# Patient Record
Sex: Female | Born: 1970
Health system: Southern US, Community
[De-identification: ages and names within clinical notes are randomized; demographics above are authoritative.]

## PROBLEM LIST (undated history)

## (undated) DIAGNOSIS — G8929 Other chronic pain: Secondary | ICD-10-CM

## (undated) HISTORY — PX: TUBAL LIGATION: SHX77

## (undated) HISTORY — DX: Other chronic pain: G89.29

---

## 1998-03-29 ENCOUNTER — Encounter: Payer: Self-pay | Admitting: Emergency Medicine

## 1998-03-29 ENCOUNTER — Emergency Department (HOSPITAL_COMMUNITY): Admission: EM | Admit: 1998-03-29 | Discharge: 1998-03-29 | Payer: Self-pay | Admitting: Emergency Medicine

## 1998-03-31 ENCOUNTER — Emergency Department (HOSPITAL_COMMUNITY): Admission: EM | Admit: 1998-03-31 | Discharge: 1998-03-31 | Payer: Self-pay | Admitting: Emergency Medicine

## 1998-04-07 ENCOUNTER — Ambulatory Visit (HOSPITAL_COMMUNITY): Admission: RE | Admit: 1998-04-07 | Discharge: 1998-04-07 | Payer: Self-pay | Admitting: Emergency Medicine

## 1998-04-07 ENCOUNTER — Encounter: Payer: Self-pay | Admitting: Emergency Medicine

## 1998-04-20 ENCOUNTER — Ambulatory Visit (HOSPITAL_COMMUNITY): Admission: RE | Admit: 1998-04-20 | Discharge: 1998-04-20 | Payer: Self-pay | Admitting: Neurosurgery

## 1998-04-20 ENCOUNTER — Encounter: Payer: Self-pay | Admitting: Neurosurgery

## 1998-11-09 ENCOUNTER — Other Ambulatory Visit: Admission: RE | Admit: 1998-11-09 | Discharge: 1998-11-09 | Payer: Self-pay | Admitting: Internal Medicine

## 1998-11-29 ENCOUNTER — Inpatient Hospital Stay (HOSPITAL_COMMUNITY): Admission: AD | Admit: 1998-11-29 | Discharge: 1998-11-29 | Payer: Self-pay | Admitting: *Deleted

## 1998-11-30 ENCOUNTER — Inpatient Hospital Stay (HOSPITAL_COMMUNITY): Admission: AD | Admit: 1998-11-30 | Discharge: 1998-11-30 | Payer: Self-pay | Admitting: Obstetrics & Gynecology

## 1999-08-30 ENCOUNTER — Other Ambulatory Visit: Admission: RE | Admit: 1999-08-30 | Discharge: 1999-08-30 | Payer: Self-pay | Admitting: Internal Medicine

## 1999-09-05 ENCOUNTER — Inpatient Hospital Stay (HOSPITAL_COMMUNITY): Admission: EM | Admit: 1999-09-05 | Discharge: 1999-09-05 | Payer: Self-pay | Admitting: *Deleted

## 1999-09-05 ENCOUNTER — Encounter: Payer: Self-pay | Admitting: Internal Medicine

## 2000-07-02 ENCOUNTER — Emergency Department (HOSPITAL_COMMUNITY): Admission: EM | Admit: 2000-07-02 | Discharge: 2000-07-02 | Payer: Self-pay | Admitting: Emergency Medicine

## 2000-07-03 ENCOUNTER — Other Ambulatory Visit: Admission: RE | Admit: 2000-07-03 | Discharge: 2000-07-03 | Payer: Self-pay | Admitting: Gynecology

## 2000-08-26 ENCOUNTER — Encounter: Payer: Self-pay | Admitting: Obstetrics & Gynecology

## 2000-08-26 ENCOUNTER — Inpatient Hospital Stay (HOSPITAL_COMMUNITY): Admission: AD | Admit: 2000-08-26 | Discharge: 2000-08-26 | Payer: Self-pay | Admitting: Obstetrics & Gynecology

## 2000-08-28 ENCOUNTER — Ambulatory Visit (HOSPITAL_COMMUNITY): Admission: RE | Admit: 2000-08-28 | Discharge: 2000-08-28 | Payer: Self-pay | Admitting: *Deleted

## 2000-08-28 ENCOUNTER — Encounter (INDEPENDENT_AMBULATORY_CARE_PROVIDER_SITE_OTHER): Payer: Self-pay

## 2000-09-17 ENCOUNTER — Encounter: Admission: RE | Admit: 2000-09-17 | Discharge: 2000-09-17 | Payer: Self-pay | Admitting: Obstetrics & Gynecology

## 2002-08-02 ENCOUNTER — Inpatient Hospital Stay (HOSPITAL_COMMUNITY): Admission: AD | Admit: 2002-08-02 | Discharge: 2002-08-02 | Payer: Self-pay | Admitting: Family Medicine

## 2002-08-02 ENCOUNTER — Encounter: Payer: Self-pay | Admitting: *Deleted

## 2002-10-15 ENCOUNTER — Encounter: Payer: Self-pay | Admitting: Emergency Medicine

## 2002-10-15 ENCOUNTER — Ambulatory Visit (HOSPITAL_COMMUNITY): Admission: RE | Admit: 2002-10-15 | Discharge: 2002-10-15 | Payer: Self-pay | Admitting: Emergency Medicine

## 2002-10-15 ENCOUNTER — Emergency Department (HOSPITAL_COMMUNITY): Admission: AD | Admit: 2002-10-15 | Discharge: 2002-10-15 | Payer: Self-pay | Admitting: Emergency Medicine

## 2002-12-26 ENCOUNTER — Emergency Department (HOSPITAL_COMMUNITY): Admission: EM | Admit: 2002-12-26 | Discharge: 2002-12-26 | Payer: Self-pay | Admitting: Emergency Medicine

## 2002-12-30 ENCOUNTER — Emergency Department (HOSPITAL_COMMUNITY): Admission: AD | Admit: 2002-12-30 | Discharge: 2002-12-30 | Payer: Self-pay | Admitting: Family Medicine

## 2003-01-05 ENCOUNTER — Encounter: Admission: RE | Admit: 2003-01-05 | Discharge: 2003-01-05 | Payer: Self-pay | Admitting: Family Medicine

## 2003-04-01 ENCOUNTER — Encounter: Admission: RE | Admit: 2003-04-01 | Discharge: 2003-04-01 | Payer: Self-pay | Admitting: Family Medicine

## 2003-04-20 ENCOUNTER — Other Ambulatory Visit: Admission: RE | Admit: 2003-04-20 | Discharge: 2003-04-20 | Payer: Self-pay | Admitting: Family Medicine

## 2003-04-20 ENCOUNTER — Encounter: Admission: RE | Admit: 2003-04-20 | Discharge: 2003-04-20 | Payer: Self-pay | Admitting: Sports Medicine

## 2003-04-22 ENCOUNTER — Ambulatory Visit (HOSPITAL_COMMUNITY): Admission: RE | Admit: 2003-04-22 | Discharge: 2003-04-22 | Payer: Self-pay | Admitting: Sports Medicine

## 2003-05-28 ENCOUNTER — Encounter: Admission: RE | Admit: 2003-05-28 | Discharge: 2003-05-28 | Payer: Self-pay | Admitting: Family Medicine

## 2003-06-04 ENCOUNTER — Ambulatory Visit (HOSPITAL_COMMUNITY): Admission: RE | Admit: 2003-06-04 | Discharge: 2003-06-04 | Payer: Self-pay | Admitting: Family Medicine

## 2003-06-16 ENCOUNTER — Inpatient Hospital Stay (HOSPITAL_COMMUNITY): Admission: AD | Admit: 2003-06-16 | Discharge: 2003-06-17 | Payer: Self-pay | Admitting: *Deleted

## 2003-06-22 ENCOUNTER — Encounter: Admission: RE | Admit: 2003-06-22 | Discharge: 2003-06-22 | Payer: Self-pay | Admitting: Family Medicine

## 2003-06-23 ENCOUNTER — Encounter: Admission: RE | Admit: 2003-06-23 | Discharge: 2003-06-23 | Payer: Self-pay | Admitting: Obstetrics & Gynecology

## 2003-06-30 ENCOUNTER — Encounter: Admission: RE | Admit: 2003-06-30 | Discharge: 2003-06-30 | Payer: Self-pay | Admitting: *Deleted

## 2003-07-14 ENCOUNTER — Encounter: Admission: RE | Admit: 2003-07-14 | Discharge: 2003-07-14 | Payer: Self-pay | Admitting: *Deleted

## 2003-07-28 ENCOUNTER — Encounter: Admission: RE | Admit: 2003-07-28 | Discharge: 2003-07-28 | Payer: Self-pay | Admitting: *Deleted

## 2003-08-11 ENCOUNTER — Ambulatory Visit (HOSPITAL_COMMUNITY): Admission: RE | Admit: 2003-08-11 | Discharge: 2003-08-11 | Payer: Self-pay | Admitting: Obstetrics and Gynecology

## 2003-08-11 ENCOUNTER — Encounter: Admission: RE | Admit: 2003-08-11 | Discharge: 2003-08-11 | Payer: Self-pay | Admitting: *Deleted

## 2003-08-17 ENCOUNTER — Encounter: Admission: RE | Admit: 2003-08-17 | Discharge: 2003-08-17 | Payer: Self-pay | Admitting: *Deleted

## 2003-08-18 ENCOUNTER — Encounter: Admission: RE | Admit: 2003-08-18 | Discharge: 2003-08-18 | Payer: Self-pay | Admitting: *Deleted

## 2003-09-02 ENCOUNTER — Encounter: Admission: RE | Admit: 2003-09-02 | Discharge: 2003-09-02 | Payer: Self-pay | Admitting: *Deleted

## 2003-09-09 ENCOUNTER — Encounter: Admission: RE | Admit: 2003-09-09 | Discharge: 2003-09-09 | Payer: Self-pay | Admitting: *Deleted

## 2003-09-22 ENCOUNTER — Encounter: Admission: RE | Admit: 2003-09-22 | Discharge: 2003-09-22 | Payer: Self-pay | Admitting: *Deleted

## 2003-09-29 ENCOUNTER — Encounter: Admission: RE | Admit: 2003-09-29 | Discharge: 2003-09-29 | Payer: Self-pay | Admitting: *Deleted

## 2003-10-08 ENCOUNTER — Ambulatory Visit: Admission: RE | Admit: 2003-10-08 | Discharge: 2003-10-08 | Payer: Self-pay | Admitting: *Deleted

## 2003-10-11 ENCOUNTER — Inpatient Hospital Stay (HOSPITAL_COMMUNITY): Admission: RE | Admit: 2003-10-11 | Discharge: 2003-10-11 | Payer: Self-pay | Admitting: *Deleted

## 2003-10-13 ENCOUNTER — Encounter: Admission: RE | Admit: 2003-10-13 | Discharge: 2003-10-13 | Payer: Self-pay | Admitting: *Deleted

## 2003-10-18 ENCOUNTER — Encounter: Admission: RE | Admit: 2003-10-18 | Discharge: 2003-10-18 | Payer: Self-pay | Admitting: *Deleted

## 2003-10-18 ENCOUNTER — Ambulatory Visit (HOSPITAL_COMMUNITY): Admission: RE | Admit: 2003-10-18 | Discharge: 2003-10-18 | Payer: Self-pay | Admitting: *Deleted

## 2003-10-20 ENCOUNTER — Encounter: Admission: RE | Admit: 2003-10-20 | Discharge: 2003-10-20 | Payer: Self-pay | Admitting: *Deleted

## 2003-10-26 ENCOUNTER — Ambulatory Visit: Payer: Self-pay | Admitting: *Deleted

## 2003-10-26 ENCOUNTER — Inpatient Hospital Stay (HOSPITAL_COMMUNITY): Admission: RE | Admit: 2003-10-26 | Discharge: 2003-10-29 | Payer: Self-pay | Admitting: *Deleted

## 2003-10-26 ENCOUNTER — Encounter (INDEPENDENT_AMBULATORY_CARE_PROVIDER_SITE_OTHER): Payer: Self-pay | Admitting: Specialist

## 2003-11-05 ENCOUNTER — Inpatient Hospital Stay (HOSPITAL_COMMUNITY): Admission: AD | Admit: 2003-11-05 | Discharge: 2003-11-05 | Payer: Self-pay | Admitting: *Deleted

## 2003-12-13 ENCOUNTER — Ambulatory Visit: Payer: Self-pay | Admitting: Family Medicine

## 2004-04-21 IMAGING — US US OB DETAIL+14 WK
1 series · 7 of 7 positions shown · non-contrast
Comparison: none

CLINICAL DATA: 32-year-old G3P1 with LMP of 02/26/03.  Evaluate anatomy.  Patient is 18 weeks 4 days by first ultrasound.

[Series 1: unknown · 0.18mm/px · 7 of 7 slices shown]
[im 1/7]
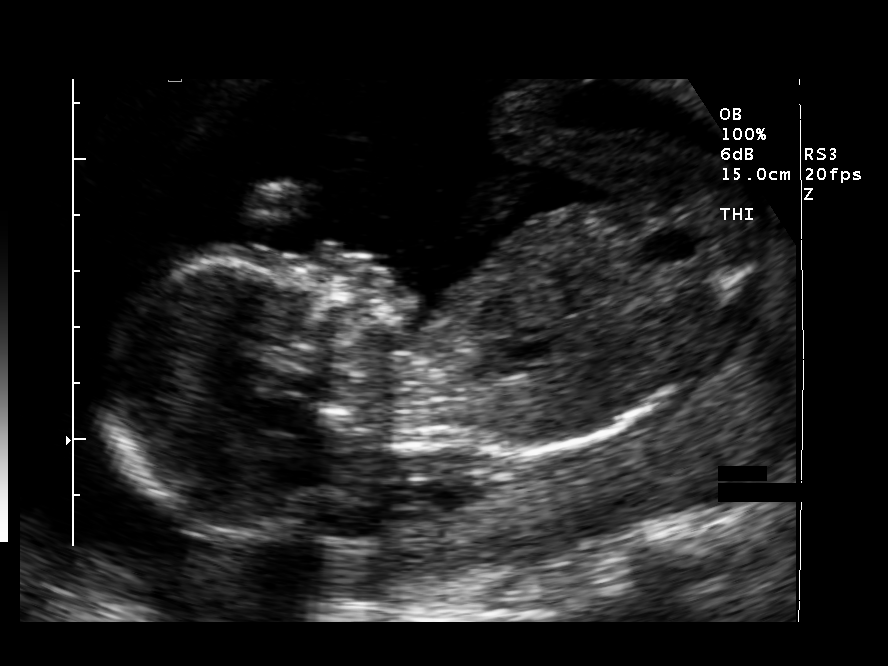
[im 2/7]
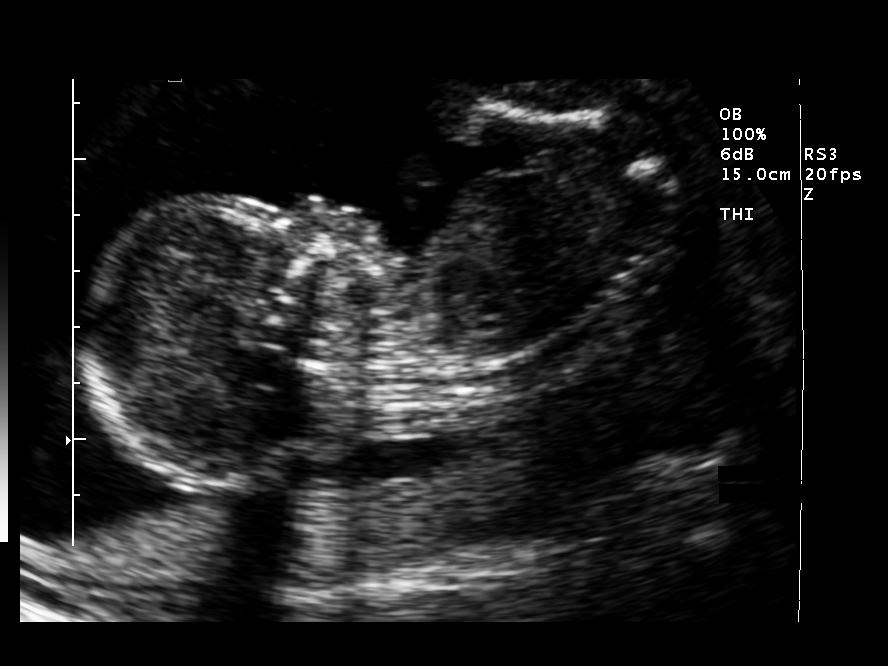
[im 3/7]
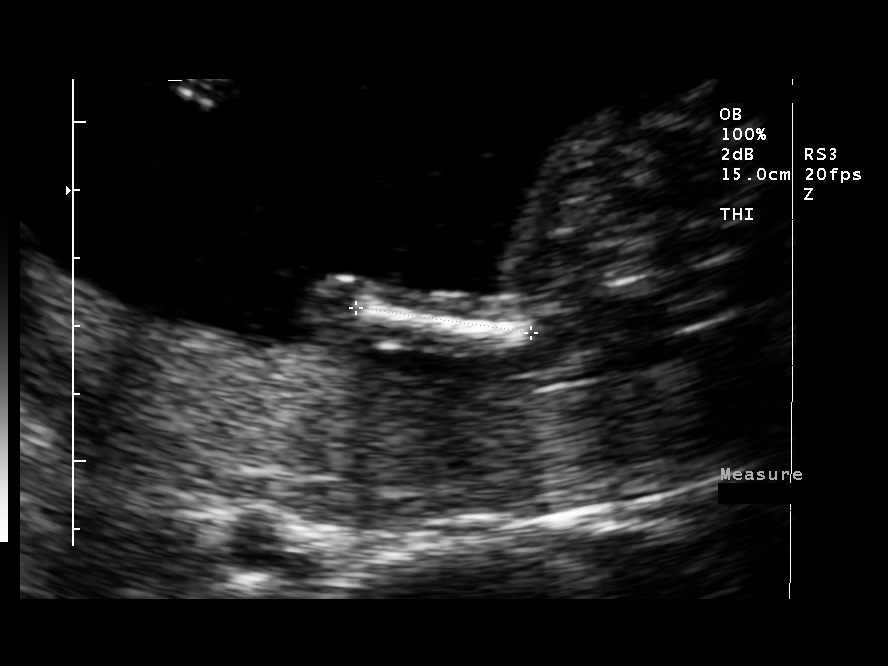
[im 4/7]
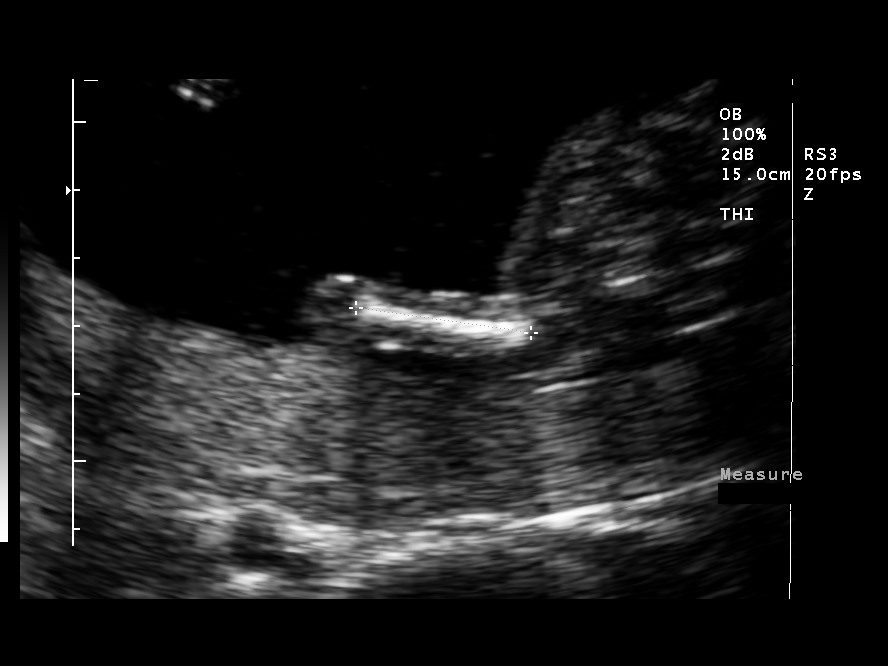
[im 5/7]
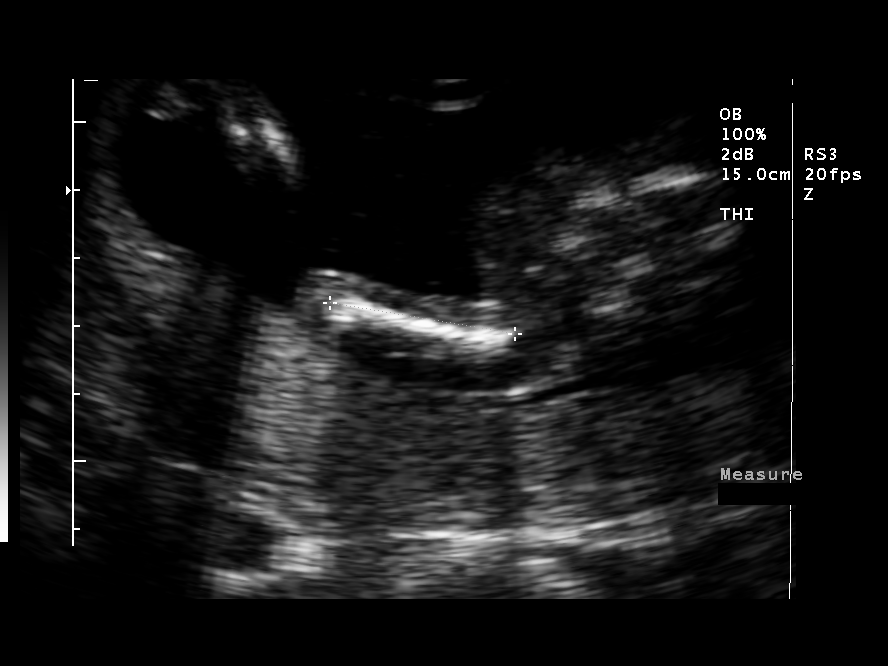
[im 6/7]
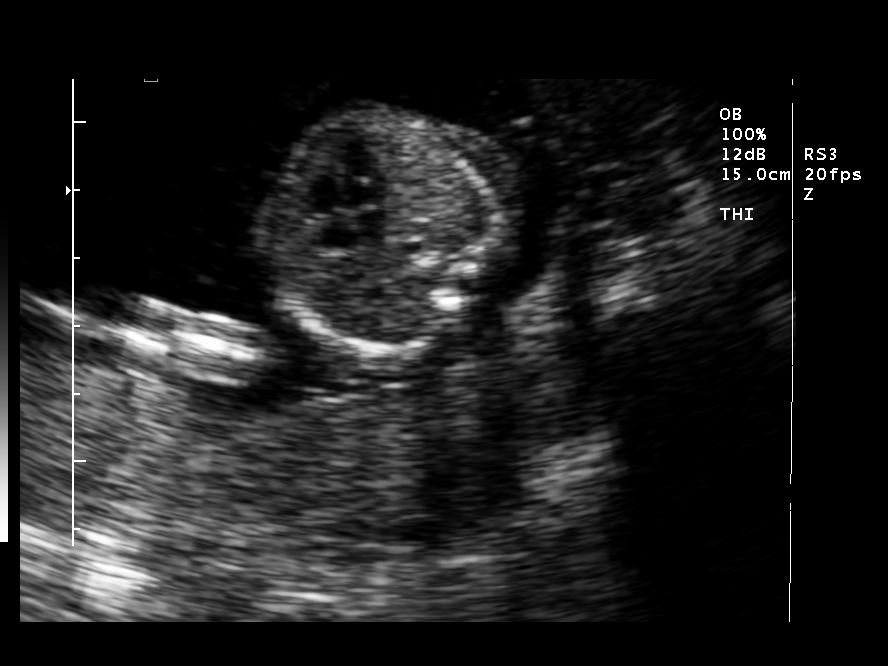
[im 7/7]
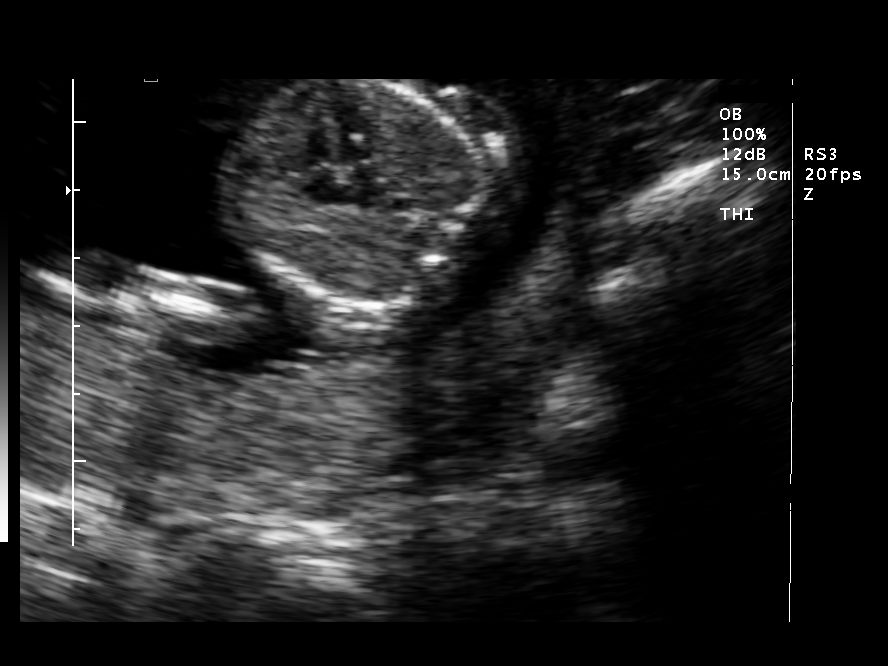

[7 of 7 positions shown; findings below may reference images not displayed]

DETAILED OBSTETRICAL ULTRASOUND 

 Number of Fetuses: 1
 Heart Rate: 137 BPM
 Movement: Yes
 Breathing: No
 Presentation: Breech
 Placental Location: Posterior
 Grade: I
 Previa: Complete
 Amniotic Fluid (Subjective): Normal
 Amniotic Fluid (Objective): 4.0 cm vertical pocket

 FETAL BIOMETRY
 BPD: 4.2 cm, 18W 5D
 HC: 15.7 cm, 18W 4D
 AC: 13.0 cm, 18W 4D
 FL: 2.5 cm, 17W 5D
 HL: 2.7 cm, 18W 4D

 MEAN GA: 18W 4D

 FETAL ANATOMY
 Lateral Ventricles: Visualized 
 Thalami/CSP: Visualized 
 Posterior Fossa: Visualized 
 Nuchal Region: Visualized 
 Spine: Visualized 
 4 Chamber Heart on Left: Visualized 
 Stomach on Left: Visualized 
 3 Vessel Cord: Visualized 
 Cord Insertion Site: Visualized 
 Kidneys: Visualized 
 Bladder: Visualized 
 Extremities: Visualized 

 ADDITIONAL ANATOMY VISUALIZED: LVOT, upper lip, orbits, profile, diaphragm, heel, 5th digit, and aortic arch.
 Comment:  Note is made of an echogenic intracardiac focus in the left ventricle.  Nasal bone is seen.
 MATERNAL FINDINGS
 Cervix:  3.1 cm transabdominal
IMPRESSION: Single living intrauterine fetus in breech presentation.  Size and dates correlate well.  Note is made of a complete placenta previa, and follow-up is recommended.  Note is made of an intracardiac echogenic focus, but no other anomalies are identified.   
 The finding of echogenic intracardiac focus is considered a sonographic marker for Down syndrome but has a low specificity, as it is seen in up to 5% of normal fetuses.  As an isolated finding, it lowers the recommended age for amniocentesis to 34.  Correlation with maternal serum triple screen results is recommended.

 </u12:p>

## 2004-08-07 ENCOUNTER — Encounter (INDEPENDENT_AMBULATORY_CARE_PROVIDER_SITE_OTHER): Payer: Self-pay | Admitting: *Deleted

## 2004-08-07 ENCOUNTER — Observation Stay (HOSPITAL_COMMUNITY): Admission: RE | Admit: 2004-08-07 | Discharge: 2004-08-08 | Payer: Self-pay | Admitting: Surgery

## 2005-06-20 ENCOUNTER — Other Ambulatory Visit: Admission: RE | Admit: 2005-06-20 | Discharge: 2005-06-20 | Payer: Self-pay | Admitting: Family Medicine

## 2005-06-20 ENCOUNTER — Ambulatory Visit: Payer: Self-pay | Admitting: Family Medicine

## 2005-06-22 ENCOUNTER — Ambulatory Visit (HOSPITAL_COMMUNITY): Admission: RE | Admit: 2005-06-22 | Discharge: 2005-06-22 | Payer: Self-pay | Admitting: *Deleted

## 2005-06-30 ENCOUNTER — Encounter (INDEPENDENT_AMBULATORY_CARE_PROVIDER_SITE_OTHER): Payer: Self-pay | Admitting: *Deleted

## 2005-06-30 LAB — CONVERTED CEMR LAB

## 2005-07-17 ENCOUNTER — Ambulatory Visit: Payer: Self-pay | Admitting: Family Medicine

## 2005-07-23 ENCOUNTER — Ambulatory Visit: Payer: Self-pay | Admitting: Family Medicine

## 2005-07-31 ENCOUNTER — Ambulatory Visit: Payer: Self-pay | Admitting: Family Medicine

## 2005-09-03 ENCOUNTER — Ambulatory Visit: Payer: Self-pay | Admitting: Family Medicine

## 2005-09-04 ENCOUNTER — Ambulatory Visit (HOSPITAL_COMMUNITY): Admission: RE | Admit: 2005-09-04 | Discharge: 2005-09-04 | Payer: Self-pay | Admitting: Vascular Surgery

## 2005-09-12 ENCOUNTER — Ambulatory Visit: Payer: Self-pay | Admitting: Family Medicine

## 2005-09-17 ENCOUNTER — Ambulatory Visit: Payer: Self-pay | Admitting: Family Medicine

## 2005-09-24 ENCOUNTER — Ambulatory Visit: Payer: Self-pay | Admitting: Family Medicine

## 2005-10-02 ENCOUNTER — Ambulatory Visit: Payer: Self-pay | Admitting: Sports Medicine

## 2005-10-03 ENCOUNTER — Ambulatory Visit: Payer: Self-pay | Admitting: Gynecology

## 2005-10-04 ENCOUNTER — Encounter: Admission: RE | Admit: 2005-10-04 | Discharge: 2006-01-02 | Payer: Self-pay | Admitting: Family Medicine

## 2005-10-05 ENCOUNTER — Ambulatory Visit: Payer: Self-pay | Admitting: Obstetrics & Gynecology

## 2005-10-09 ENCOUNTER — Ambulatory Visit: Payer: Self-pay | Admitting: Obstetrics and Gynecology

## 2005-10-12 ENCOUNTER — Ambulatory Visit: Payer: Self-pay | Admitting: Obstetrics & Gynecology

## 2005-10-16 ENCOUNTER — Ambulatory Visit: Payer: Self-pay | Admitting: Obstetrics and Gynecology

## 2005-10-17 ENCOUNTER — Ambulatory Visit: Payer: Self-pay | Admitting: Sports Medicine

## 2005-10-19 ENCOUNTER — Ambulatory Visit: Payer: Self-pay | Admitting: Obstetrics & Gynecology

## 2005-10-19 ENCOUNTER — Ambulatory Visit (HOSPITAL_COMMUNITY): Admission: RE | Admit: 2005-10-19 | Discharge: 2005-10-19 | Payer: Self-pay | Admitting: Family Medicine

## 2005-10-23 ENCOUNTER — Ambulatory Visit: Payer: Self-pay | Admitting: Obstetrics and Gynecology

## 2005-10-23 ENCOUNTER — Ambulatory Visit: Payer: Self-pay | Admitting: Family Medicine

## 2005-10-30 ENCOUNTER — Inpatient Hospital Stay (HOSPITAL_COMMUNITY): Admission: AD | Admit: 2005-10-30 | Discharge: 2005-11-02 | Payer: Self-pay | Admitting: Obstetrics and Gynecology

## 2005-10-30 ENCOUNTER — Encounter (INDEPENDENT_AMBULATORY_CARE_PROVIDER_SITE_OTHER): Payer: Self-pay | Admitting: Specialist

## 2005-10-30 ENCOUNTER — Ambulatory Visit: Payer: Self-pay | Admitting: Obstetrics and Gynecology

## 2005-12-19 ENCOUNTER — Ambulatory Visit: Payer: Self-pay | Admitting: Family Medicine

## 2006-05-23 DIAGNOSIS — E669 Obesity, unspecified: Secondary | ICD-10-CM | POA: Insufficient documentation

## 2006-05-24 ENCOUNTER — Encounter (INDEPENDENT_AMBULATORY_CARE_PROVIDER_SITE_OTHER): Payer: Self-pay | Admitting: *Deleted

## 2006-05-28 ENCOUNTER — Ambulatory Visit: Payer: Self-pay | Admitting: Family Medicine

## 2006-05-28 DIAGNOSIS — M545 Low back pain: Secondary | ICD-10-CM | POA: Insufficient documentation

## 2007-11-18 ENCOUNTER — Ambulatory Visit: Payer: Self-pay | Admitting: *Deleted

## 2007-11-18 ENCOUNTER — Ambulatory Visit: Payer: Self-pay | Admitting: Internal Medicine

## 2010-08-01 ENCOUNTER — Encounter: Payer: Self-pay | Admitting: *Deleted

## 2010-08-11 NOTE — Op Note (Signed)
[name], Jennifer Gilmore               ACCOUNT NO.:  [id number]   MEDICAL RECORD NO.:  [id number]          PATIENT TYPE:  INP   LOCATION:                                FACILITY[facility]  [facility]   PHYSICIAN:  Phil D. Okey Dupre, M.D.     DATE OF BIRTH:  May 14, 1970   DATE OF PROCEDURE:  10/30/2005  DATE OF DISCHARGE:                                 OPERATIVE REPORT   PROCEDURE:  Low transverse cesarean section and bilateral tubal ligation.   PREOPERATIVE DIAGNOSIS:  Repeat cesarean section at term and voluntary  sterilization.   POSTOPERATIVE DIAGNOSIS:  Repeat cesarean section at term and voluntary  sterilization.   SURGEON:  Dr. Elinor Dodge.   ANESTHESIA:  Spinal.   ESTIMATED BLOOD LOSS:  500 mL.   POSTOPERATIVE CONDITION:  Satisfactory.   Placenta to pathology.   DESCRIPTION OF PROCEDURE:  Under satisfactory spinal anesthesia with a Foley  catheter in the urinary bladder, the abdomen was prepped and draped in the  usual sterile manner and entered through a previous C-section transverse  scar. The abdomen was opened in a typical Pfannenstiel fashion. On entering  the peritoneal cavity, the visceroperitoneum on the anterior surface of the  uterus was opened transversely by sharp dissection, the bladder pushed away  from the lower segment. The uterus was then entered by sharp and blunt  dissection and from a ROT presentation with a vacuum assistance, the baby's  head was delivered. The baby was then delivered easily after the vertex,  cord doubly clamped and divided, baby handed to the pediatrician.  Samples  of blood taken from the cord for analysis.  The placenta manually removed.  The uterus explored and closed with a continuous running locked #0 Vicryl  and an atraumatic needle. Filshie clips were used in the mid portion of each  fallopian tube for tubal occlusion after the tubes had been followed down to  the fimbria to make sure that we had the appropriate organs. The area was  observed for  bleeding, none was noted.  The pelvis was irrigated with normal  saline.  The fascia was then closed with a continuous running #0 Vicryl and  an atraumatic needle. The skin edges approximated with skin staples after  subcutaneous bleeding was controlled with hot cautery.  A dry sterile  dressing was applied.  Tape, instrument, sponge and needle count reported as  correct at the end the procedure.           ______________________________  Javier Glazier. Okey Dupre, M.D.     PDR/MEDQ  D:  10/30/2005  T:  10/30/2005  Job:  [id number]

## 2010-08-11 NOTE — Op Note (Signed)
NAME:  Jennifer Gilmore, Jennifer Gilmore                         ACCOUNT NO.:  [id number]   MEDICAL RECORD NO.:  [id number]                   PATIENT TYPE:  INP   LOCATION:  9133                                 [facility]ACILITY:  [facility]   PHYSICIAN:  Conni Elliot, M.D.             DATE OF BIRTH:  08/25/1970   DATE OF PROCEDURE:  10/26/2003  DATE OF DISCHARGE:                                 OPERATIVE REPORT   PREOPERATIVE DIAGNOSES:  Placenta previa at term.   POSTOPERATIVE DIAGNOSES:  Placenta previa at term.   OPERATION:  Low transverse cesarean.   SURGEON:  Conni Elliot, M.D.   ANESTHESIA:  Spinal.   FINDINGS:  8 pound 2 ounce female with Apgar's of 9 & 9.  The placenta was  sent to pathology.   DESCRIPTION OF PROCEDURE:  After the patient was placed under spinal  anesthetic, the patient was supine in the left lateral tilt position, the  abdomen was prepped and draped in normal fashion. A low transverse  Pfannenstiel incision was made and incision made through the fascia, rectus  muscle separated in the midline.  Bladder flap created. A low transverse  uterine incision was made. This was extended by bandage scissors. The baby  was delivered in vertex presentation, cord doubly clamped and cut. Baby  handed to the neonatologist in attendance.  The placenta delivered  spontaneously. The bladder flap __________ fashion. Estimated blood loss was  approximately 1000 mL.                                               Conni Elliot, M.D.    ASG/MEDQ  D:  10/26/2003  T:  10/26/2003  Job:  [id number]

## 2010-08-11 NOTE — Discharge Summary (Signed)
[name], Jennifer Gilmore               ACCOUNT NO.:  [id number]   MEDICAL RECORD NO.:  [id number]          PATIENT TYPE:  INP   LOCATION:  9108                          [facility]ILITY[facility]  [facility]   PHYSICIAN:  Phil D. Okey Dupre, M.D.     DATE OF BIRTH:  09/05/1970   DATE OF ADMISSION:  10/30/2005  DATE OF DISCHARGE:  11/02/2005                                 DISCHARGE SUMMARY   DISCHARGE DIAGNOSES:  1. Repeat low transverse Cesarean section.  2. Voluntary bilateral tubal ligation.   HISTORY AND HOSPITAL COURSE:  This is a 40 year old G4, P3-0-1-3 who  presented at 39-2/[redacted] weeks gestation for a scheduled repeat Cesarean section.  The patient's pregnancy was complicated by gestational diabetes for which  she took Glyburide.  The procedure was done under spinal anesthesia.  Estimated blood loss was 500 cc.  The baby's Apgar's were 8 and 9.  Mom and  baby were stable post procedures.  Mom had an uneventful postoperative  course.  Her fasting CBG was 101.  She was discharged home on postoperative  day #3 in good condition.  She is to followup at the [facility]  in six weeks.   DISCHARGE MEDICATIONS:  1. Percocet 5/325 mg p.o. q.4h. p.r.n.  2. Ibuprofen 600 mg p.o. q.6h. p.r.n.  3. Colace 100 mg p.o. b.i.d. p.r.n.   DISCHARGE INSTRUCTIONS:  Nothing per vagina x6 weeks.  Regular diet at home.  No heavy lifting higher than the weight of her baby x4 weeks.  Followup at  the [facility] in six weeks.      Altamese Cabal, M.D.    ______________________________  Javier Glazier. Okey Dupre, M.D.    KS/MEDQ  D:  11/02/2005  T:  11/02/2005  Job:  [id number]

## 2010-08-11 NOTE — Op Note (Signed)
[facility]  Patient:    SAACHI, ZALE                      MRN: [id number] Proc. Date: 08/28/00 Adm. Date:  [id number] Attending:  Michaelle Copas CC:         [facility]   Operative Report  PREOPERATIVE DIAGNOSIS:       A 7-week intrauterine fetal demise.  POSTOPERATIVE DIAGNOSIS:      A 7-week intrauterine fetal demise.  PROCEDURE:                    Suction, dilation, and evacuation.  SURGEON:                      Charles A. Clearance Coots, M.D.  ANESTHESIA:                   MAC with paracervical block.  ESTIMATED BLOOD LOSS:         100 ml.  COMPLICATIONS:                None.  SPECIMEN:                     Products of conception.  DESCRIPTION OF PROCEDURE:     The patient was brought to the operating room and after satisfactory IV sedation, the legs were brought up in stirrups, and the vagina was prepped and draped in the usual sterile fashion. The urinary bladder was emptied of approximately 50 cc of clear urine. Bimanual examination revealed the uterus to be about 7 to 8-week size, midposition. A sterile speculum was inserted in the vaginal vault and the cervix was isolated. The anterior lip of the cervix was grasped with a single-tooth tenaculum. Paracervical block total of about 20 ml of 2% Xylocaine was injected in the lateral fornices at 3 and 9 oclock position, approximately 10 ml in each area. The uterus was then sounded and the cervix was dilated to a #25 Pratt dilator. A #8 suction catheter was then easily introduced into the uterine cavity and all contents were evacuated. There was no active bleeding at the conclusion of the procedure and the uterus contracted down quite well. All instruments were then retired. The patient tolerated the procedure well and was transported to recovery room in satisfactory condition. DD:  08/28/00 TD:  08/28/00 Job: [id number]

## 2010-08-11 NOTE — Op Note (Signed)
Jennifer Gilmore, Jennifer Gilmore               ACCOUNT NO.:  [id number]   MEDICAL RECORD NO.:  [id number]          PATIENT TYPE:  AMB   LOCATION:  DAY                          FACILITY:  [facility]   PHYSICIAN:  Thornton Park. Daphine Deutscher, MD  DATE OF BIRTH:  08/20/1970   DATE OF PROCEDURE:  08/07/2004  DATE OF DISCHARGE:                                 OPERATIVE REPORT   CCS#:  [id number]   PREOPERATIVE DIAGNOSES:  Chronic cholecystitis.   POSTOPERATIVE DIAGNOSES:  Severe chronic cholecystitis with normal  intraoperative cholangiogram.   PROCEDURE:  Laparoscopic cholecystectomy with intraoperative cholangiogram.   SURGEON:  Thornton Park. Daphine Deutscher, MD  Aug 06, 2004.   ASSESSMENT:  Jennifer Gilmore, M.D.   ANESTHESIA:  General endotracheal.   DESCRIPTION OF PROCEDURE:  Jennifer Gilmore was brought to OR 1 on Aug 07, 2004 and given general anesthesia. The abdomen was prepped with Betadine  and draped sterilely. A longitudinal incision was made down into the  umbilicus through which I was able to insert a Hasson cannula. The abdomen  was insufflated and then three trocars were placed in the upper abdomen.  Some Curtis-Fitz-Hugh bands were lysed over the right lobe. The gallbladder  had pretty much an intrahepatic lie but could be seen from the top  undersurface of the liver. The gallbladder was grasped and elevated and she  had a lot of adhesions to her duodenum which were taken down with sharp  dissection with scissors and avoided electrocautery. I then dissected free  Calot's triangle and found that there was a marked degree of inflammation  and scarring that was very stuck and very hard to delineate the anatomy but  I worked through that. I then isolated the cystic duct, put a clip up on the  gallbladder and inserted the Reddick catheter and did a dynamic  cholangiogram showing the cystic common duct junction. I then triple clipped  the cystic duct, divided it and clipped both anterior and posterior  cystic  arteries and divided those. I then removed the gallbladder from the  gallbladder bed without entering it. The gallbladder bed was inspected and  no bleeding or bile leaks were noted. The gallbladder was placed in a bag  and brought out through the umbilicus although I did have to enlarge the  umbilical port side because of the large stone that had been impacted in the  gallbladder neck. This was then subsequently removed with a bag. The  umbilical side was repaired with two sutures of #0 Vicryl. I then  inspected that from within __________ where I injected all the port sites  with 0.5% Marcaine, deflated the abdomen and closed the skin with 4-0  Vicryl. The patient seemed to tolerate the procedure well and was taken to  the recovery room in satisfactory condition.      MBM/MEDQ  D:  08/07/2004  T:  08/07/2004  Job:  [id number]

## 2010-08-11 NOTE — Discharge Summary (Signed)
NAME:  Jennifer Gilmore, Jennifer Gilmore                         ACCOUNT NO.:  [id number]   MEDICAL RECORD NO.:  [id number]                   PATIENT TYPE:  INP   LOCATION:  9133                                 [facility]ILITY:  [facility]   PHYSICIAN:  Conni Elliot, M.D.             DATE OF BIRTH:  05/13/1970   DATE OF ADMISSION:  10/26/2003  DATE OF DISCHARGE:  10/29/2003                                 DISCHARGE SUMMARY   PRIMARY CARE PHYSICIAN:  Rob Hickman, M.D., [facility].   ATTENDING PHYSICIAN:  Conni Elliot, M.D.   ADMISSION DIAGNOSES:  Placenta previa requiring low transverse cesarean  section.   PROCEDURES:  Low transverse cesarean section.   LABORATORY DATA:  Initial hemoglobin was 11.3.  Hemoglobin after procedure  is 9.8.  White count after procedure is 9.5.   HOSPITAL COURSE:  The patient is a 40 year old G3, P2-0-1-2 who was admitted  on October 26, 2003, for low transverse cesarean section secondary to placenta  previa on ultrasound.  The patient was taken to the operating room and the  procedure was performed.  The patient gave birth to an 8 pound 2 ounce baby  boy, Apgars 9 and 9 at 1 minute and 5 minutes, respectively.  There was no  complications.  Estimated blood loss for the procedure was approximately  1000 mL and did not require any replacement.  The postoperative course was  unremarkable except for some mild constipation.  The patient was monitored  in the hospital for three days postoperative.  The patient states that she  wishes to breast feed her baby boy, named __________, and also supplement  with bottle feeding.  The patient states that she would like an IUD for  birth control.  In the meantime, we will place the patient on Micronor so  that she can breast feed her infant.  I would appreciate if Dr. Dahlia Byes  would discuss an IUD at the followup visit in six weeks.  The patient was  tolerating p.o. throughout the hospital stay.  She was  ambulating in the  hallway without difficulty.  She was having no problem with voiding.  Her  pain was controlled with ibuprofen and the occasional Percocet.  On the day  of discharge, the only complaint that the patient had was some mild  constipation.  We will discharge the patient with some Senokot secondary to  this problem.   DISCHARGE MEDICATIONS:  1. Senokot S two tablets p.o. q.h.s. p.r.n. constipation.  2. Ibuprofen 600 mg one p.o. q8h. p.r.n. pain.  3. Prenatal vitamins one p.o. every day.  4. Colace 100 mg one p.o. every day.  5. Percocet 5/325 mg tablets one p.o. q.6h. p.r.n. breakthrough pain.  The     patient was given 10 tablets.  6. Micronor birth control pills one p.o. every day.  The patient was given a     one month  supply with one refill.   FOLLOWUP INSTRUCTIONS:  The patient was instructed to follow up herself for  a postpartum check on December 13, 2003, at 1:30 p.m. with Dr. Dahlia Byes  at the [facility].  An appointment has been  scheduled for November 05, 2003, at 8:45 a.m. with Dr. Dahlia Byes to evaluate  the infant's progress.  At this appointment we would appreciate if Dr.  Dahlia Byes would discuss with the patient IUD as well as do the routine  postpartum check.     Broadus John T. Pamalee Leyden, MD                  Conni Elliot, M.D.    Alvia Grove  D:  10/29/2003  T:  10/31/2003  Job:  [id number]

## 2010-12-20 NOTE — Telephone Encounter (Signed)
This encounter was created in error - please disregard.

## 2019-01-04 ENCOUNTER — Encounter (HOSPITAL_BASED_OUTPATIENT_CLINIC_OR_DEPARTMENT_OTHER): Payer: Self-pay | Admitting: Emergency Medicine

## 2019-01-04 ENCOUNTER — Other Ambulatory Visit: Payer: Self-pay

## 2019-01-04 ENCOUNTER — Emergency Department (HOSPITAL_BASED_OUTPATIENT_CLINIC_OR_DEPARTMENT_OTHER)
Admission: EM | Admit: 2019-01-04 | Discharge: 2019-01-04 | Disposition: A | Discharge status: Home / Self Care | Payer: Self-pay | Attending: Emergency Medicine | Admitting: Emergency Medicine

## 2019-01-04 DIAGNOSIS — N10 Acute tubulo-interstitial nephritis: Secondary | ICD-10-CM | POA: Insufficient documentation

## 2019-01-04 DIAGNOSIS — N12 Tubulo-interstitial nephritis, not specified as acute or chronic: Secondary | ICD-10-CM

## 2019-01-04 DIAGNOSIS — M62830 Muscle spasm of back: Secondary | ICD-10-CM | POA: Insufficient documentation

## 2019-01-04 LAB — URINALYSIS, ROUTINE W REFLEX MICROSCOPIC
Bilirubin Urine: NEGATIVE
Glucose, UA: NEGATIVE mg/dL
Ketones, ur: NEGATIVE mg/dL
Leukocytes,Ua: NEGATIVE
Nitrite: NEGATIVE
Protein, ur: NEGATIVE mg/dL
Specific Gravity, Urine: 1.02 (ref 1.005–1.030)
pH: 6 (ref 5.0–8.0)

## 2019-01-04 LAB — URINALYSIS, MICROSCOPIC (REFLEX)

## 2019-01-04 MED ORDER — MELOXICAM 7.5 MG PO TABS
7.5000 mg | ORAL_TABLET | Freq: Once | ORAL | Status: AC
  Administered 2019-01-04: 7.5 mg via ORAL
  Filled 2019-01-04: qty 1

## 2019-01-04 MED ORDER — CEPHALEXIN 500 MG PO CAPS: 500.0000 mg | ORAL_CAPSULE | Freq: Four times a day (QID) | ORAL | 0 refills | Status: DC

## 2019-01-04 MED ORDER — IBUPROFEN 800 MG PO TABS: 800.0000 mg | ORAL_TABLET | Freq: Three times a day (TID) | ORAL | 0 refills | Status: DC

## 2019-01-04 NOTE — ED Notes (Signed)
ED Provider at bedside. 

## 2019-01-04 NOTE — ED Triage Notes (Signed)
Pt c/o left side flank pain x 3 days. Pt denies fever, denies dysuria. Pt denies injury

## 2019-01-04 NOTE — Discharge Instructions (Signed)
Please call [facility]  Please read the attachment and take the medications as prescribed.  Return to the ED if you develop any fevers, chills, incontinence, neurologic deficits, or other new or worsening signs and symptoms.

## 2019-01-04 NOTE — ED Provider Notes (Signed)
[facility] EMERGENCY DEPARTMENT Provider Note   CSN: [id number] Arrival date & time: 01/04/19  1149     History   Chief Complaint Chief Complaint  Patient presents with  . Back Pain    HPI Jennifer Gilmore is a 48 y.o. female with past medical history significant for chronic low back pain who presents to the ED with a 3-day history of acute onset left-sided low back pain.  She states that the pain is aggravated by bending, lifting, twisting, and ambulation.  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  She also endorses foul-smelling odor over the same 3-day period.  She does not have a PCP.  She denies any fevers, chills, personal history cancer, precipitating trauma, saddle anesthesia, radiating pain, radiculopathy, incontinence, painful urination, increased urinary frequency, or abdominal pain.    HPI  No past medical history on file.  Patient Active Problem List   Diagnosis Date Noted  . BACK PAIN, LUMBAR, CHRONIC 05/28/2006  . OBESITY, NOS 05/23/2006      OB History   No obstetric history on file.      Home Medications    Prior to Admission medications   Medication Sig Start Date End Date Taking? Authorizing Provider  cephALEXin (KEFLEX) 500 MG capsule Take 1 capsule (500 mg total) by mouth 4 (four) times daily. 01/04/19   Lorelee New, PA-C  ibuprofen (ADVIL) 800 MG tablet Take 1 tablet (800 mg total) by mouth 3 (three) times daily. 01/04/19   Lorelee New, PA-C    Family History No family history on file.  Social History Social History   Tobacco Use  . Smoking status: Never Smoker  Substance Use Topics  . Alcohol use: Yes  . Drug use: Never     Allergies   Patient has no allergy information on record.   Review of Systems Review of Systems  All other systems reviewed and are negative.    Physical Exam Updated Vital Signs BP 128/78 (BP Location: Right Arm)   Pulse 71   Temp 98.5 F (36.9 C)  (Oral)   Resp 18   Ht 5\' 9"  (1.753 m)   Wt 99.8 kg   LMP 12/21/2018   SpO2 100%   BMI 32.49 kg/m   Physical Exam Vitals signs and nursing note reviewed. Exam conducted with a chaperone present.  Constitutional:      Appearance: Normal appearance. She is obese.  HENT:     Head: Normocephalic and atraumatic.  Eyes:     General: No scleral icterus.    Conjunctiva/sclera: Conjunctivae normal.  Pulmonary:     Effort: Pulmonary effort is normal.  Musculoskeletal:     Comments: Left-sided lumbar tenderness to palpation.  Muscle feels contracted. SLR negative bilaterally. Positive CVAT.  Skin:    General: Skin is dry.  Neurological:     Mental Status: She is alert and oriented to person, place, and time.     GCS: GCS eye subscore is 4. GCS verbal subscore is 5. GCS motor subscore is 6.     Cranial Nerves: No cranial nerve deficit.     Sensory: No sensory deficit.     Motor: No weakness.     Coordination: Coordination normal.     Comments: Antalgic gait.  Psychiatric:        Mood and Affect: Mood normal.        Behavior: Behavior normal.        Thought  Content: Thought content normal.      ED Treatments / Results  Labs (all labs ordered are listed, but only abnormal results are displayed) Labs Reviewed  URINALYSIS, ROUTINE W REFLEX MICROSCOPIC - Abnormal; Notable for the following components:      Result Value   APPearance CLOUDY (*)    Hgb urine dipstick LARGE (*)    All other components within normal limits  URINALYSIS, MICROSCOPIC (REFLEX) - Abnormal; Notable for the following components:   Bacteria, UA MANY (*)    All other components within normal limits  URINE CULTURE    EKG None  Radiology No results found.  Procedures Procedures (including critical care time)  Medications Ordered in ED Medications  meloxicam (MOBIC) tablet 7.5 mg (7.5 mg Oral Given 01/04/19 1425)     Initial Impression / Assessment and Plan / ED Course  I have reviewed the triage  vital signs and the nursing notes.  Pertinent labs & imaging results that were available during my care of the patient were reviewed by me and considered in my medical decision making (see chart for details).        Reviewed urinalysis which demonstrates cloudy appearance and many bacteria.  Given that she reports foul-smelling odor in conjunction with her onset left-sided flank pain, will treat for possible pyelonephritis.  She also has left-sided CVAT on exam, but could also be musculoskeletal.  She has been taking ibuprofen regularly which could explain why she has not experienced any fevers or chills.  Will prescribe her antibiotics and treat outpatient.  Also send out urine culture.  Based on her report that her pain is associated with bending, lifting, twisting, and ambulation and given the fact that I appreciate muscle contracture on my physical exam, suspect that she could be having muscle spasms.  Will prescribe her ibuprofen 800 mg 3 times daily as needed for her discomfort.  We will also prescribe her Flexeril to relieve her muscle spasms.  Provided her precautions regarding use of the Flexeril medication.  While there are also large RBCs seen in UA, her discomfort has clear triggers and patterns and is not described as colicky.  She does not report a history of kidney stones and I do not suspect that is what she is experiencing.  She also recently had her menses which could explain the RBCs.  Do not believe that a CT is warranted at this point.  We will also recommend that she [facility] for [facility] and management of her chronic low back pain.  Return to the ED if you develop any fevers, chills, incontinence, neurologic deficits, or other new or worsening signs and symptoms.  She voiced understanding and is agreeable to the plan.  Final Clinical Impressions(s) / ED Diagnoses   Final diagnoses:  Pyelonephritis  Muscle  spasm of back    ED Discharge Orders         Ordered    ibuprofen (ADVIL) 800 MG tablet  3 times daily     01/04/19 1431    cephALEXin (KEFLEX) 500 MG capsule  4 times daily     01/04/19 1431           Reita Chard 01/04/19 1434    Gareth Morgan, MD 01/05/19 1216

## 2019-01-04 NOTE — ED Notes (Signed)
Pt c/o L mid to low back pain, denies injury.

## 2019-01-06 LAB — URINE CULTURE: Culture: >=100000 — AB

## 2019-01-07 ENCOUNTER — Telehealth: Payer: Self-pay | Admitting: *Deleted

## 2019-01-07 NOTE — Telephone Encounter (Signed)
Post ED Visit - Positive Culture Follow-up  Culture report reviewed by antimicrobial stewardship pharmacist: [place] Team []  Elenor Quinones, Pharm.D. []  Heide Guile, Pharm.D., BCPS AQ-ID []  Parks Neptune, Pharm.D., BCPS []  Alycia Rossetti, Pharm.D., BCPS []  [place], Pharm.D., BCPS, AAHIVP []  Legrand Como, Pharm.D., BCPS, AAHIVP [x]  Salome Arnt, PharmD, BCPS []  Johnnette Gourd, PharmD, BCPS []  Hughes Better, PharmD, BCPS []  Leeroy Cha, PharmD []  Laqueta Linden, PharmD, BCPS []  Albertina Parr, PharmD  [place] Team []  Leodis Sias, PharmD []  Lindell Spar, PharmD []  Royetta Asal, PharmD []  Graylin Shiver, Rph []  Rema Fendt) Glennon Mac, PharmD []  Arlyn Dunning, PharmD []  Netta Cedars, PharmD []  Dia Sitter, PharmD []  Leone Haven, PharmD []  Gretta Arab, PharmD []  Theodis Shove, PharmD []  Peggyann Juba, PharmD []  Reuel Boom, PharmD   Positive urine culture Treated with Cephalexin, organism sensitive to the same and no further patient follow-up is required at this time.  Harlon Flor [facility] 01/07/2019, 10:05 AM

## 2019-01-27 ENCOUNTER — Encounter: Payer: Self-pay | Admitting: Nurse Practitioner

## 2019-01-27 ENCOUNTER — Ambulatory Visit (HOSPITAL_COMMUNITY)
Admission: RE | Admit: 2019-01-27 | Discharge: 2019-01-27 | Disposition: A | Discharge status: Home / Self Care | Payer: Self-pay | Source: Ambulatory Visit | Attending: Nurse Practitioner | Admitting: Nurse Practitioner

## 2019-01-27 ENCOUNTER — Other Ambulatory Visit: Payer: Self-pay

## 2019-01-27 ENCOUNTER — Ambulatory Visit: Payer: Self-pay | Attending: Nurse Practitioner | Admitting: Nurse Practitioner

## 2019-01-27 DIAGNOSIS — M545 Low back pain: Secondary | ICD-10-CM | POA: Insufficient documentation

## 2019-01-27 DIAGNOSIS — G8929 Other chronic pain: Secondary | ICD-10-CM | POA: Insufficient documentation

## 2019-01-27 DIAGNOSIS — Z13220 Encounter for screening for lipoid disorders: Secondary | ICD-10-CM

## 2019-01-27 LAB — POCT URINALYSIS DIP (CLINITEK)
Bilirubin, UA: NEGATIVE
Glucose, UA: NEGATIVE mg/dL
Ketones, POC UA: NEGATIVE mg/dL
Leukocytes, UA: NEGATIVE
Nitrite, UA: NEGATIVE
POC PROTEIN,UA: NEGATIVE
Spec Grav, UA: 1.01 (ref 1.010–1.025)
Urobilinogen, UA: 0.2 E.U./dL
pH, UA: 6.5 (ref 5.0–8.0)

## 2019-01-27 MED ORDER — PREDNISONE 20 MG PO TABS: 20.0000 mg | ORAL_TABLET | Freq: Every day | ORAL | 0 refills | Status: AC

## 2019-01-27 MED ORDER — CYCLOBENZAPRINE HCL 10 MG PO TABS: 10.0000 mg | ORAL_TABLET | Freq: Three times a day (TID) | ORAL | 0 refills | Status: DC | PRN

## 2019-01-27 MED FILL — CYCLOBENZAPRINE 10 MG TAB: 10 | 20 days supply | Qty: 60 | Fill #0

## 2019-01-27 MED FILL — predniSONE 20 MG TABS: 20 | 7 days supply | Qty: 7 | Fill #0

## 2019-01-27 NOTE — Progress Notes (Signed)
Virtual Visit via Telephone Note Due to national recommendations of social distancing due to [place] 19, telehealth visit is felt to be most appropriate for this patient at this time.  I discussed the limitations, risks, security and privacy concerns of performing an evaluation and management service by telephone and the availability of in person appointments. I also discussed with the patient that there may be a patient responsible charge related to this service. The patient expressed understanding and agreed to proceed.    I connected with Jennifer Gilmore on 01/27/19  at  10:50 AM EST  EDT by telephone and verified that I am speaking with the correct person using two identifiers.   Consent I discussed the limitations, risks, security and privacy concerns of performing an evaluation and management service by telephone and the availability of in person appointments. I also discussed with the patient that there may be a patient responsible charge related to this service. The patient expressed understanding and agreed to proceed.   Location of Patient: Private Residence   Location of Provider: [place] participating in Telemedicine visit: Geryl Rankins FNP-BC [place]    History of Present Illness: Telemedicine visit for: Establish Care  has a past medical history of Chronic lower back pain.   Back Pain: Patient presents for evaluation of low back problems.  Symptoms have been present for several years and include pain in left  lumbar region (aching and sharp in character; 8/10 in severity). Initial inciting event: none. Symptoms are worst: all day. Alleviating factors identifiable by patient are none. Exacerbating factors identifiable by patient are all activities. Treatments so far initiated by patient: ibuprofen 800 mg, aleve, motrin (both ineffective). Using a heating pad at night due to worsening back pain with lying down.   Previous workup: none. She denies any GU symptoms at this time.  Past Medical History:  Diagnosis Date  . Chronic lower back pain     History reviewed. No pertinent surgical history.  Family History  Problem Relation Age of Onset  . Depression Neg Hx   . Anxiety disorder Neg Hx   . Alcohol abuse Neg Hx     Social History   Socioeconomic History  . Marital status: Single    Spouse name: Not on file  . Number of children: Not on file  . Years of education: Not on file  . Highest education level: Not on file  Occupational History  . Not on file  Social Needs  . Financial resource strain: Not on file  . Food insecurity    Worry: Not on file    Inability: Not on file  . Transportation needs    Medical: Not on file    Non-medical: Not on file  Tobacco Use  . Smoking status: Never Smoker  . Smokeless tobacco: Never Used  Substance and Sexual Activity  . Alcohol use: Yes  . Drug use: Never  . Sexual activity: Not on file  Lifestyle  . Physical activity    Days per week: Not on file    Minutes per session: Not on file  . Stress: Not on file  Relationships  . Social Herbalist on phone: Not on file    Gets together: Not on file    Attends religious service: Not on file    Active member of club or organization: Not on file    Attends meetings of clubs or organizations:  Not on file    Relationship status: Not on file  Other Topics Concern  . Not on file  Social History Narrative  . Not on file     Observations/Objective: Awake, alert and oriented x 3   Review of Systems  Constitutional: Negative for chills, diaphoresis, fever, malaise/fatigue and weight loss.  Respiratory: Negative.  Negative for cough, sputum production and shortness of breath.   Cardiovascular: Negative.  Negative for chest pain, palpitations, claudication and leg swelling.  Gastrointestinal: Negative for abdominal pain, constipation, diarrhea and heartburn.  Musculoskeletal:  Positive for back pain and myalgias. Negative for falls, joint pain and neck pain.  Neurological: Positive for tingling. Negative for dizziness, tremors, sensory change, speech change, focal weakness, loss of consciousness, weakness and headaches.  Psychiatric/Behavioral: Negative for depression, hallucinations, memory loss, substance abuse and suicidal ideas. The patient is not nervous/anxious and does not have insomnia.     Assessment and Plan: Aspyn was seen today for hospitalization follow-up.  Diagnoses and all orders for this visit:  Chronic left-sided low back pain without sciatica -     cyclobenzaprine (FLEXERIL) 10 MG tablet; Take 1 tablet (10 mg total) by mouth 3 (three) times daily as needed for muscle spasms. -     DG Lumbar Spine Complete; Future -     CULTURE, URINE COMPREHENSIVE -     POCT URINALYSIS DIP (CLINITEK) -     Cervicovaginal ancillary only -     CBC -     CMP14+EGFR -     predniSONE (DELTASONE) 20 MG tablet; Take 1 tablet (20 mg total) by mouth daily with breakfast for 7 days. Work on losing weight to help reduce back pain. May alternate with heat and ice application for pain relief. May also alternate with acetaminophen  as prescribed for back pain. Other alternatives include massage, acupuncture and water aerobics.  You must stay active and avoid a sedentary lifestyle.   Lipid screening -     Lipid panel     Follow Up Instructions Return in about 4 weeks (around 02/24/2019).     I discussed the assessment and treatment plan with the patient. The patient was provided an opportunity to ask questions and all were answered. The patient agreed with the plan and demonstrated an understanding of the instructions.   The patient was advised to call back or seek an in-person evaluation if the symptoms worsen or if the condition fails to improve as anticipated.  I provided 21 minutes of non-face-to-face time during this encounter including median intraservice time,  reviewing previous notes, labs, imaging, medications and explaining diagnosis and management.  Gildardo Pounds, FNP-BC

## 2019-01-27 NOTE — Progress Notes (Signed)
Pt is requesting something stronger than ibuprofen

## 2019-01-28 ENCOUNTER — Encounter: Payer: Self-pay | Admitting: Nurse Practitioner

## 2019-01-28 LAB — CMP14+EGFR
ALT: 11 IU/L (ref 0–32)
AST: 11 IU/L (ref 0–40)
Albumin/Globulin Ratio: 1.9 (ref 1.2–2.2)
Albumin: 4.7 g/dL (ref 3.8–4.8)
Alkaline Phosphatase: 70 IU/L (ref 39–117)
BUN/Creatinine Ratio: 10 (ref 9–23)
BUN: 7 mg/dL (ref 6–24)
Bilirubin Total: 0.3 mg/dL (ref 0.0–1.2)
CO2: 21 mmol/L (ref 20–29)
Calcium: 9 mg/dL (ref 8.7–10.2)
Chloride: 101 mmol/L (ref 96–106)
Creatinine, Ser: 0.68 mg/dL (ref 0.57–1.00)
GFR calc Af Amer: 121 mL/min/{1.73_m2} (ref >59)
GFR calc non Af Amer: 105 mL/min/{1.73_m2} (ref >59)
Globulin, Total: 2.5 g/dL (ref 1.5–4.5)
Glucose: 148 mg/dL — ABNORMAL HIGH (ref 65–99)
Potassium: 4.4 mmol/L (ref 3.5–5.2)
Sodium: 136 mmol/L (ref 134–144)
Total Protein: 7.2 g/dL (ref 6.0–8.5)

## 2019-01-28 LAB — CBC
Hematocrit: 37.5 % (ref 34.0–46.6)
Hemoglobin: 12.5 g/dL (ref 11.1–15.9)
MCH: 30.3 pg (ref 26.6–33.0)
MCHC: 33.3 g/dL (ref 31.5–35.7)
MCV: 91 fL (ref 79–97)
Platelets: 341 10*3/uL (ref 150–450)
RBC: 4.12 x10E6/uL (ref 3.77–5.28)
RDW: 12.5 % (ref 11.7–15.4)
WBC: 6.1 10*3/uL (ref 3.4–10.8)

## 2019-01-28 LAB — LIPID PANEL
Chol/HDL Ratio: 2.4 ratio (ref 0.0–4.4)
Cholesterol, Total: 161 mg/dL (ref 100–199)
HDL: 68 mg/dL (ref >39)
LDL Chol Calc (NIH): 79 mg/dL (ref 0–99)
Triglycerides: 70 mg/dL (ref 0–149)
VLDL Cholesterol Cal: 14 mg/dL (ref 5–40)

## 2019-01-30 ENCOUNTER — Telehealth: Payer: Self-pay | Admitting: Nurse Practitioner

## 2019-01-30 LAB — CERVICOVAGINAL ANCILLARY ONLY
Bacterial Vaginitis (gardnerella): POSITIVE — AB
Candida Glabrata: NEGATIVE
Candida Vaginitis: NEGATIVE
Chlamydia: NEGATIVE
Comment: NEGATIVE
Comment: NEGATIVE
Comment: NEGATIVE
Comment: NEGATIVE
Comment: NEGATIVE
Comment: NORMAL
Neisseria Gonorrhea: NEGATIVE
Trichomonas: POSITIVE — AB

## 2019-01-30 LAB — CULTURE, URINE COMPREHENSIVE

## 2019-01-30 NOTE — Telephone Encounter (Signed)
Patient called for labs  °

## 2019-01-31 ENCOUNTER — Other Ambulatory Visit: Payer: Self-pay | Admitting: Nurse Practitioner

## 2019-01-31 MED ORDER — SULFAMETHOXAZOLE-TRIMETHOPRIM 800-160 MG PO TABS: 1.0000 | ORAL_TABLET | Freq: Two times a day (BID) | ORAL | 0 refills | Status: AC

## 2019-01-31 MED ORDER — METRONIDAZOLE 500 MG PO TABS: 500.0000 mg | ORAL_TABLET | Freq: Two times a day (BID) | ORAL | 0 refills | Status: AC

## 2019-02-02 ENCOUNTER — Telehealth: Payer: Self-pay

## 2019-02-02 ENCOUNTER — Telehealth: Payer: Self-pay | Admitting: Nurse Practitioner

## 2019-02-02 MED FILL — metroNIDAZOLE 500 MG TABS: 500 | 7 days supply | Qty: 14 | Fill #0

## 2019-02-02 MED FILL — SULFAMETHOXAZOLE-TMP DS TAB: 800-160 | 3 days supply | Qty: 6 | Fill #0

## 2019-02-02 NOTE — Telephone Encounter (Signed)
Contacted pt on 11/6 to go over lab results pt didn't answer lvm asking pt to give me a call at her earliest convenience

## 2019-02-02 NOTE — Telephone Encounter (Signed)
Patient called about the labs

## 2019-02-02 NOTE — Telephone Encounter (Signed)
Returned pt call and went over the xray results and lab results. Pt then became frustrated and stated she will call back

## 2019-03-02 ENCOUNTER — Encounter: Payer: Self-pay | Admitting: Nurse Practitioner

## 2019-03-02 ENCOUNTER — Ambulatory Visit: Payer: Self-pay | Attending: Nurse Practitioner | Admitting: Nurse Practitioner

## 2019-03-02 ENCOUNTER — Other Ambulatory Visit: Payer: Self-pay

## 2019-03-02 VITALS — BP 127/81 | HR 78 | Temp 99.4°F | Ht 68.0 in | Wt 242.0 lb

## 2019-03-02 DIAGNOSIS — E669 Obesity, unspecified: Secondary | ICD-10-CM

## 2019-03-02 DIAGNOSIS — M545 Low back pain: Secondary | ICD-10-CM

## 2019-03-02 DIAGNOSIS — G8929 Other chronic pain: Secondary | ICD-10-CM

## 2019-03-02 MED ORDER — CYCLOBENZAPRINE HCL 10 MG PO TABS: 10.0000 mg | ORAL_TABLET | Freq: Three times a day (TID) | ORAL | 1 refills | Status: AC | PRN

## 2019-03-02 MED ORDER — LIDOCAINE 5 % EX PTCH: 1.0000 | MEDICATED_PATCH | CUTANEOUS | 1 refills | Status: AC

## 2019-03-02 MED ORDER — IBUPROFEN 800 MG PO TABS: 800.0000 mg | ORAL_TABLET | Freq: Three times a day (TID) | ORAL | 2 refills | Status: AC | PRN

## 2019-03-02 MED FILL — LIDOCAINE 5 % PTCH: 5 | 30 days supply | Qty: 30 | Fill #0

## 2019-03-02 MED FILL — IBUPROFEN 800 MG TABLET: 800 | 20 days supply | Qty: 60 | Fill #0

## 2019-03-02 MED FILL — CYCLOBENZAPRINE 10 MG TAB: 10 | 20 days supply | Qty: 60 | Fill #0

## 2019-03-02 NOTE — Progress Notes (Signed)
Assessment & Plan:  Jennifer Gilmore was seen today for follow-up.  Diagnoses and all orders for this visit:  Chronic left-sided low back pain without sciatica -     ibuprofen (ADVIL) 800 MG tablet; Take 1 tablet (800 mg total) by mouth every 8 (eight) hours as needed. -     cyclobenzaprine (FLEXERIL) 10 MG tablet; Take 1 tablet (10 mg total) by mouth 3 (three) times daily as needed for muscle spasms. -     lidocaine (LIDODERM) 5 %; Place 1 patch onto the skin daily. Remove & Discard patch within 12 hours or as directed by MD Work on losing weight to help reduce back pain. BMI 36 May alternate with heat and ice application for pain relief. May also alternate with acetaminophen as prescribed for back pain. Other alternatives include massage, acupuncture and water aerobics.  You must stay active and avoid a sedentary lifestyle.  Obesity Discussed diet and exercise for person with BMI >36. Instructed: You must burn more calories than you eat. Losing 5 percent of your body weight should be considered a success. In the longer term, losing more than 15 percent of your body weight and staying at this weight is an extremely good result. However, keep in mind that even losing 5 percent of your body weight leads to important health benefits, so try not to get discouraged if you're not able to lose more than this. Will recheck weight in 3 months.  Patient has been counseled on age-appropriate routine health concerns for screening and prevention. These are reviewed and up-to-date. Referrals have been placed accordingly. Immunizations are up-to-date or declined.    Subjective:   Chief Complaint  Patient presents with  . Follow-up    Pt. is here to follow up for back pain. Pt. is still having back pain.    HPI Jennifer Gilmore 48 y.o. female presents to office today for follow up to back pain.    Back Pain She has chronic back pain. Symptoms have been present for several years and include pain in left   lumbar region (aching and sharp in character; 8/10 in severity). Initial inciting event: none. Pain described as deep pressure. Symptoms are worst: all day. Alleviating factors identifiable by patient are none. Exacerbating factors identifiable by patient:Prolonged walking and standing, lying down. She denies any GU symptoms at this time. Relieving factors: ibuprofen 800 mg, flexeril. She also has been using a back brace.     Review of Systems  Constitutional: Negative for fever, malaise/fatigue and weight loss.  HENT: Negative.  Negative for nosebleeds.   Eyes: Negative.  Negative for blurred vision, double vision and photophobia.  Respiratory: Negative.  Negative for cough and shortness of breath.   Cardiovascular: Negative.  Negative for chest pain, palpitations and leg swelling.  Gastrointestinal: Negative.  Negative for heartburn, nausea and vomiting.  Musculoskeletal: Positive for back pain. Negative for myalgias.  Neurological: Negative.  Negative for dizziness, focal weakness, seizures and headaches.  Psychiatric/Behavioral: Negative.  Negative for suicidal ideas.    Past Medical History:  Diagnosis Date  . Chronic lower back pain     Past Surgical History:  Procedure Laterality Date  . TUBAL LIGATION      Family History  Problem Relation Age of Onset  . Depression Neg Hx   . Anxiety disorder Neg Hx   . Alcohol abuse Neg Hx     Social History Reviewed with no changes to be made today.   Outpatient Medications Prior to  Visit  Medication Sig Dispense Refill  . cephALEXin (KEFLEX) 500 MG capsule Take 1 capsule (500 mg total) by mouth 4 (four) times daily. 40 capsule 0  . cyclobenzaprine (FLEXERIL) 10 MG tablet Take 1 tablet (10 mg total) by mouth 3 (three) times daily as needed for muscle spasms. 60 tablet 0   No facility-administered medications prior to visit.    No Known Allergies     Objective:    BP 127/81 (BP Location: Left Arm, Patient Position: Sitting,  Cuff Size: Large)   Pulse 78   Temp 99.4 F (37.4 C) (Oral)   Ht 5\' 8"  (1.727 m)   Wt 242 lb (109.8 kg)   SpO2 99%   BMI 36.80 kg/m  Wt Readings from Last 3 Encounters:  03/02/19 242 lb (109.8 kg)  01/04/19 220 lb (99.8 kg)    Physical Exam Vitals and nursing note reviewed.  Constitutional:      Appearance: She is well-developed.  HENT:     Head: Normocephalic and atraumatic.  Cardiovascular:     Rate and Rhythm: Normal rate and regular rhythm.     Heart sounds: Normal heart sounds. No murmur. No friction rub. No gallop.   Pulmonary:     Effort: Pulmonary effort is normal. No tachypnea or respiratory distress.     Breath sounds: Normal breath sounds. No decreased breath sounds, wheezing, rhonchi or rales.  Chest:     Chest wall: No tenderness.  Abdominal:     General: Bowel sounds are normal.     Palpations: Abdomen is soft.  Musculoskeletal:        General: Normal range of motion.     Cervical back: Normal range of motion.     Lumbar back: Tenderness present. No swelling, edema or spasms. Normal range of motion.  Skin:    General: Skin is warm and dry.  Neurological:     Mental Status: She is alert and oriented to person, place, and time.     Coordination: Coordination normal.  Psychiatric:        Behavior: Behavior normal. Behavior is cooperative.        Thought Content: Thought content normal.        Judgment: Judgment normal.          Patient has been counseled extensively about nutrition and exercise as well as the importance of adherence with medications and regular follow-up. The patient was given clear instructions to go to ER or return to medical center if symptoms don't improve, worsen or new problems develop. The patient verbalized understanding.   Follow-up: Return in about 6 weeks (around 04/13/2019) for needs application packet.   04/15/2019, FNP-BC [facility] [place], [place]   03/06/2019,  9:37 PM

## 2019-03-06 ENCOUNTER — Encounter: Payer: Self-pay | Admitting: Nurse Practitioner

## 2019-03-24 ENCOUNTER — Telehealth (HOSPITAL_COMMUNITY): Payer: Self-pay

## 2019-03-24 NOTE — Telephone Encounter (Signed)
Telephoned patient at home number. Left voice message to call and schedule with [facility]. 

## 2019-04-07 MED FILL — CYCLOBENZAPRINE 10 MG TAB: 10 | 20 days supply | Qty: 60 | Fill #1

## 2019-04-07 MED FILL — LIDOCAINE 5 % PTCH: 5 | 30 days supply | Qty: 30 | Fill #1

## 2019-04-07 MED FILL — IBUPROFEN 800 MG TABLET: 800 | 20 days supply | Qty: 60 | Fill #1

## 2019-06-18 MED FILL — IBUPROFEN 800 MG TABLET: 800 | 20 days supply | Qty: 60 | Fill #2

## 2019-12-15 IMAGING — DX DG LUMBAR SPINE COMPLETE 4+V
5 series · 5 of 5 positions shown · non-contrast
Comparison: None

CLINICAL DATA: Low back pain on LEFT side and into LEFT buttock,
symptoms for 3 weeks, no known trauma

EXAM:
LUMBAR SPINE - COMPLETE 4+ VIEW

[t lumbar spine ap]
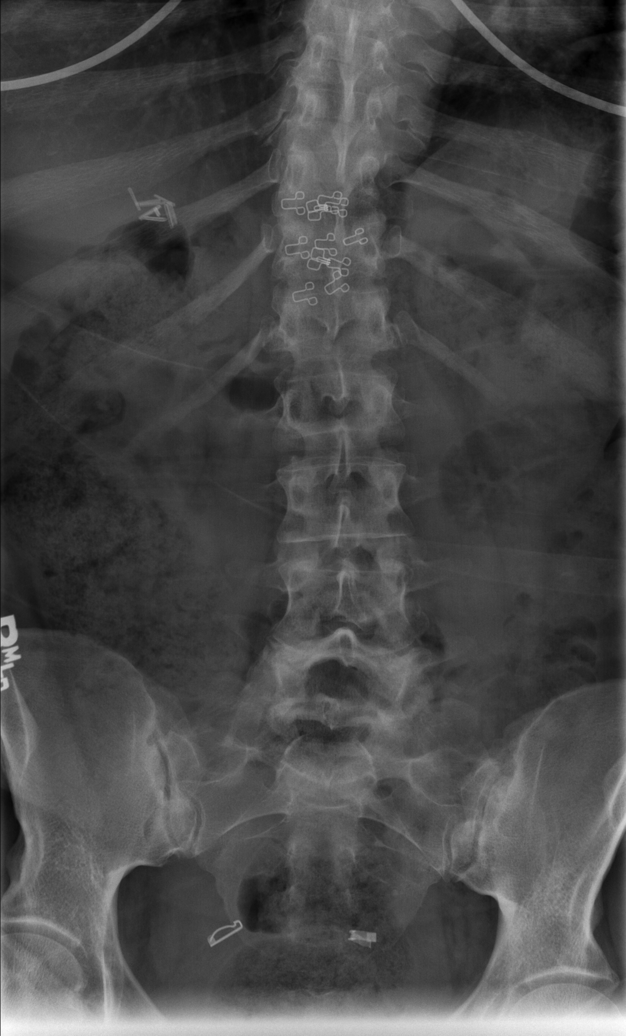

[t lumbar spine obl (1 of 2)]
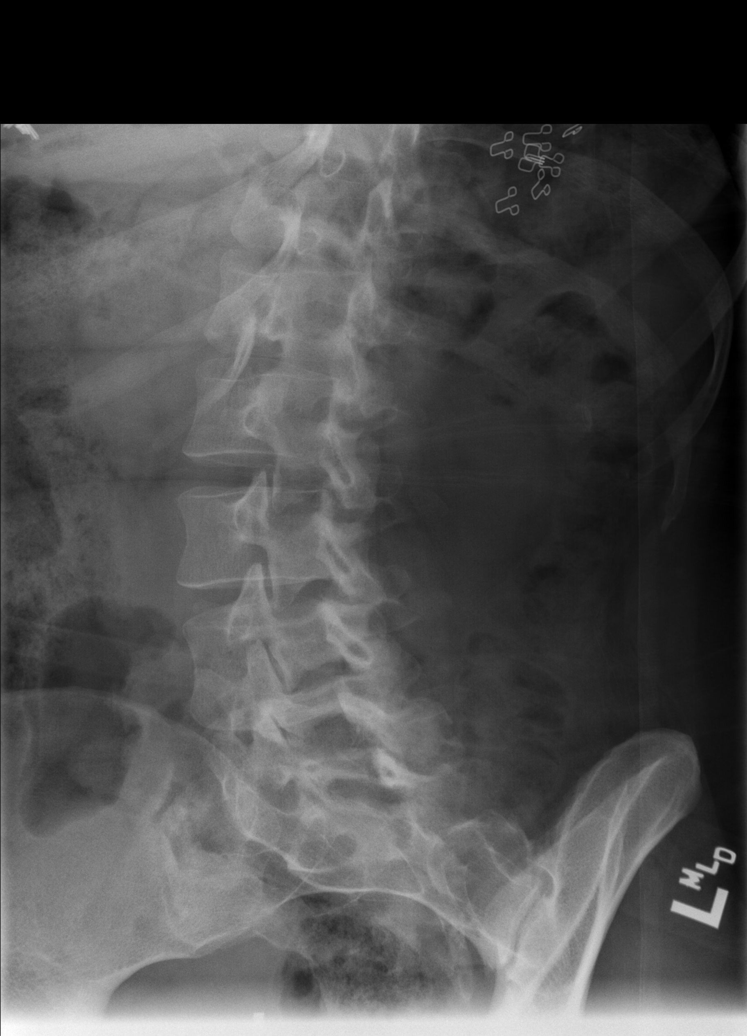

[t lumbar spine obl (2 of 2)]
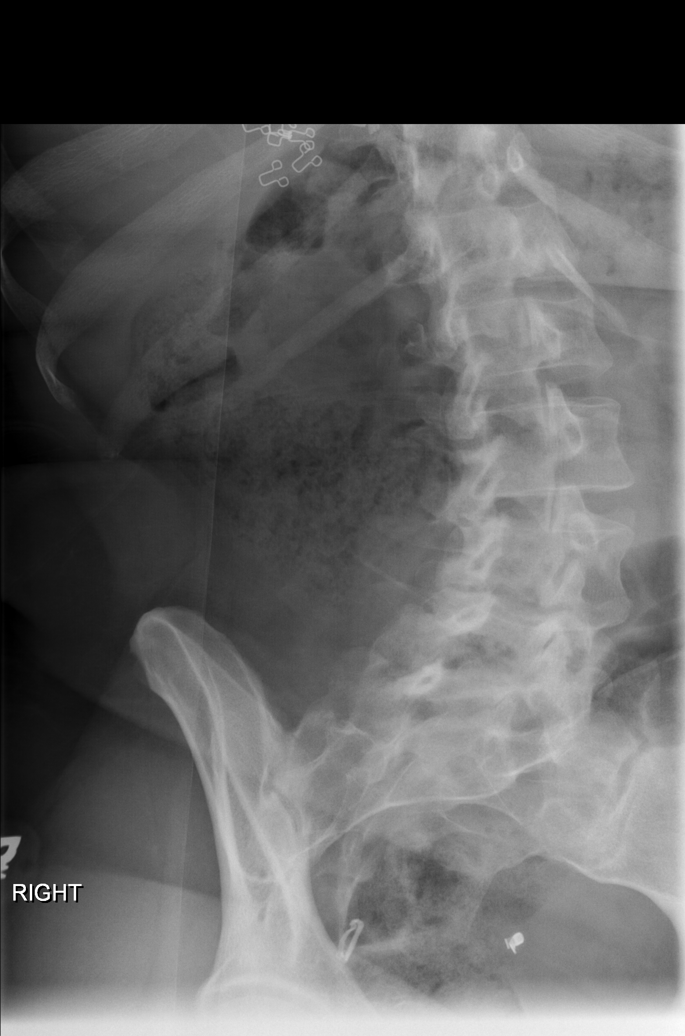

[t lumbar spine lat]
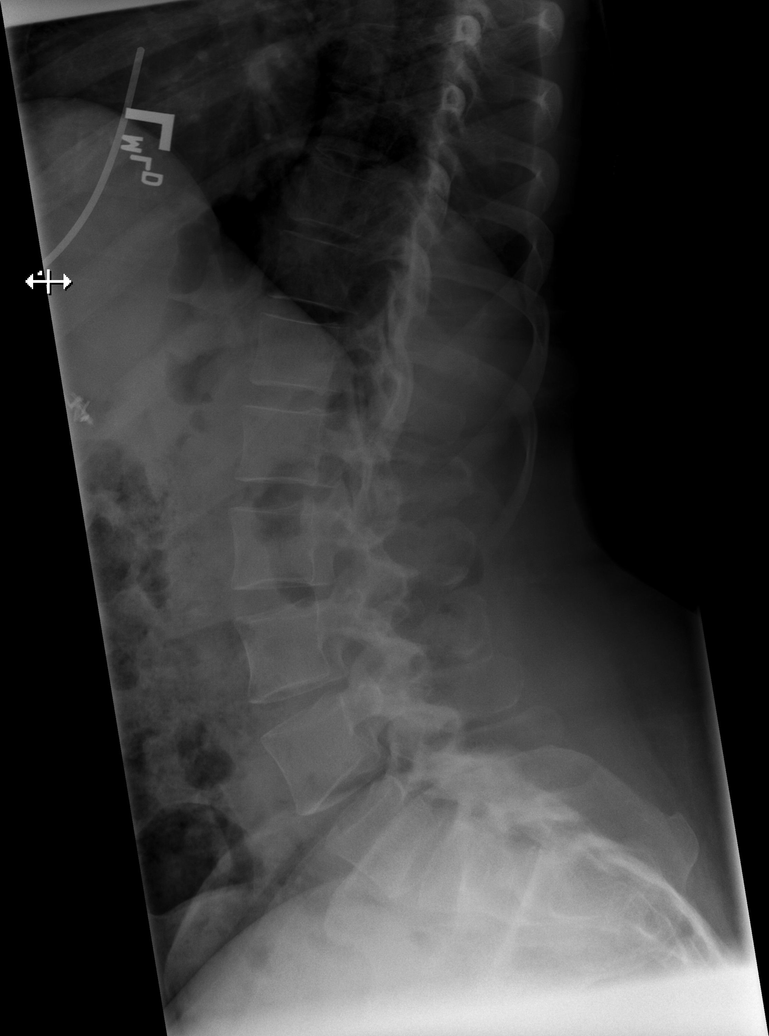

[t lumbar l-5 s-1 spot]
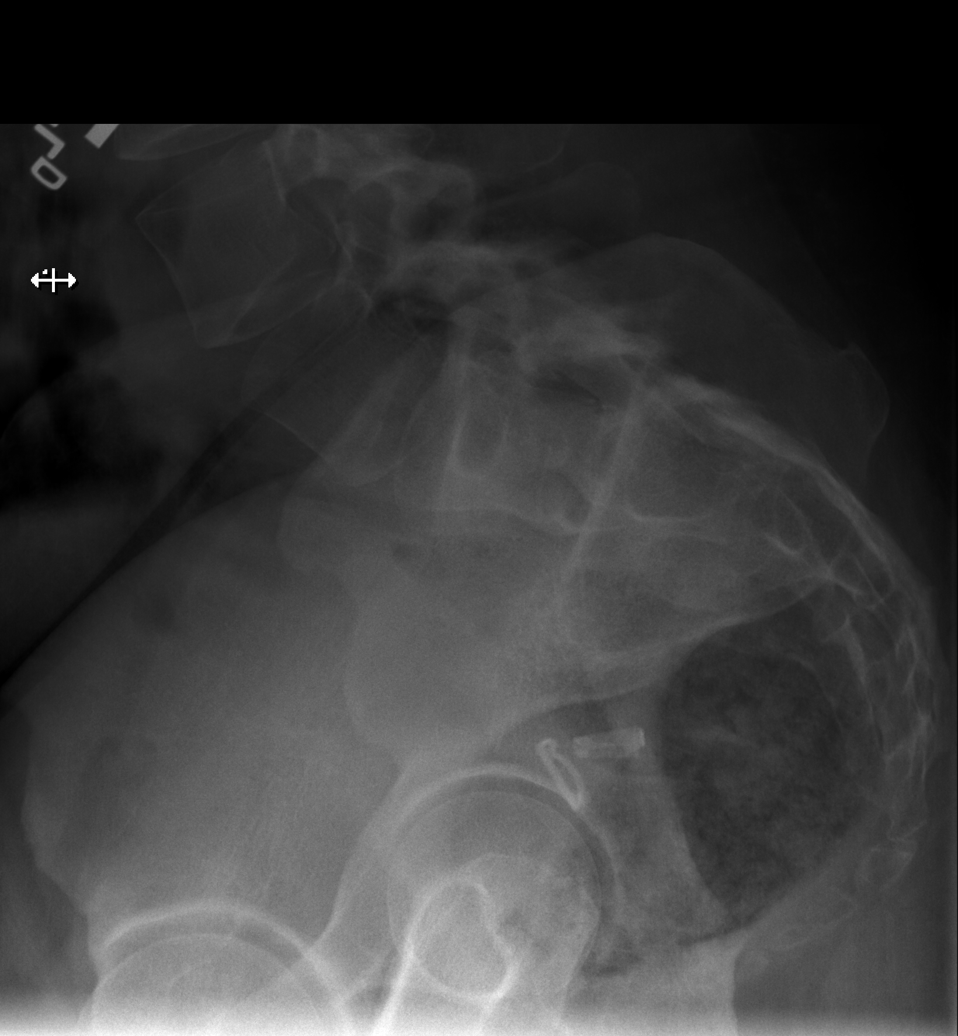

[5 of 5 positions shown; findings below may reference images not displayed]

FINDINGS: Five non-rib-bearing lumbar vertebra.

Vertebral body and disc space heights maintained.

No fracture, subluxation, or bone destruction.

SI joints preserved.

No spondylolysis.

Tubal ligation clips in pelvis.
IMPRESSION: No acute osseous abnormalities.
